# Patient Record
Sex: Female | Born: 1990 | Race: Black or African American | Hispanic: No | Marital: Single | Smoking: Never smoker
Health system: Southern US, Community
[De-identification: ages and names within clinical notes are randomized; demographics above are authoritative.]

---

## 2014-02-11 ENCOUNTER — Emergency Department: Payer: Self-pay | Admitting: Emergency Medicine

## 2014-02-24 DIAGNOSIS — Z3480 Encounter for supervision of other normal pregnancy, unspecified trimester: Secondary | ICD-10-CM | POA: Insufficient documentation

## 2014-02-25 DIAGNOSIS — Z98891 History of uterine scar from previous surgery: Secondary | ICD-10-CM | POA: Insufficient documentation

## 2014-02-25 DIAGNOSIS — O99210 Obesity complicating pregnancy, unspecified trimester: Secondary | ICD-10-CM | POA: Insufficient documentation

## 2014-04-22 DIAGNOSIS — O26892 Other specified pregnancy related conditions, second trimester: Secondary | ICD-10-CM | POA: Insufficient documentation

## 2014-04-22 DIAGNOSIS — Z6791 Unspecified blood type, Rh negative: Secondary | ICD-10-CM | POA: Insufficient documentation

## 2014-07-11 DIAGNOSIS — O34219 Maternal care for unspecified type scar from previous cesarean delivery: Secondary | ICD-10-CM | POA: Insufficient documentation

## 2015-01-07 ENCOUNTER — Encounter: Payer: Self-pay | Admitting: Emergency Medicine

## 2015-01-07 ENCOUNTER — Emergency Department
Admission: EM | Admit: 2015-01-07 | Discharge: 2015-01-07 | Disposition: A | Discharge status: Home / Self Care | Payer: Medicaid Other | Attending: Emergency Medicine | Admitting: Emergency Medicine

## 2015-01-07 DIAGNOSIS — J029 Acute pharyngitis, unspecified: Secondary | ICD-10-CM | POA: Insufficient documentation

## 2015-01-07 DIAGNOSIS — J040 Acute laryngitis: Secondary | ICD-10-CM

## 2015-01-07 LAB — POCT RAPID STREP A: Streptococcus, Group A Screen (Direct): NEGATIVE

## 2015-01-07 MED ORDER — LIDOCAINE VISCOUS 2 % MT SOLN
15.0000 mL | Freq: Once | OROMUCOSAL | Status: AC
  Administered 2015-01-07: 15 mL via OROMUCOSAL
  Filled 2015-01-07: qty 15

## 2015-01-07 MED ORDER — IBUPROFEN 600 MG PO TABS
600.0000 mg | ORAL_TABLET | Freq: Once | ORAL | Status: AC
  Administered 2015-01-07: 600 mg via ORAL
  Filled 2015-01-07: qty 1

## 2015-01-07 MED ORDER — DEXAMETHASONE 1 MG/ML PO CONC
10.0000 mg | Freq: Once | ORAL | Status: AC
  Administered 2015-01-07: 10 mg via ORAL
  Filled 2015-01-07: qty 10

## 2015-01-07 NOTE — Discharge Instructions (Signed)
Laryngitis Laryngitis is inflammation of your vocal cords. This causes hoarseness, coughing, loss of voice, sore throat, or a dry throat. Your vocal cords are two bands of muscles that are found in your throat. When you speak, these cords come together and vibrate. These vibrations come out through your mouth as sound. When your vocal cords are inflamed, your voice sounds different. Laryngitis can be temporary (acute) or long-term (chronic). Most cases of acute laryngitis improve with time. Chronic laryngitis is laryngitis that lasts for more than three weeks. CAUSES Acute laryngitis may be caused by:  A viral infection.  Lots of talking, yelling, or singing. This is also called vocal strain.  Bacterial infections. Chronic laryngitis may be caused by:  Vocal strain.  Injury to your vocal cords.  Acid reflux (gastroesophageal reflux disease or GERD).  Allergies.  Sinus infection.  Smoking.  Alcohol abuse.  Breathing in chemicals or dust.  Growths on the vocal cords. RISK FACTORS Risk factors for laryngitis include:  Smoking.  Alcohol abuse.  Having allergies. SIGNS AND SYMPTOMS Symptoms of laryngitis may include:  Low, hoarse voice.  Loss of voice.  Dry cough.  Sore throat.  Stuffy nose. DIAGNOSIS Laryngitis may be diagnosed by:  Physical exam.  Throat culture.  Blood test.  Laryngoscopy. This procedure allows your health care provider to look at your vocal cords with a mirror or viewing tube. TREATMENT Treatment for laryngitis depends on what is causing it. Usually, treatment involves resting your voice and using medicines to soothe your throat. However, if your laryngitis is caused by a bacterial infection, you may need to take antibiotic medicine. If your laryngitis is caused by a growth, you may need to have a procedure to remove it. HOME CARE INSTRUCTIONS  Drink enough fluid to keep your urine clear or pale yellow.  Breathe in moist air. Use a  humidifier if you live in a dry climate.  Take medicines only as directed by your health care provider.  If you were prescribed an antibiotic medicine, finish it all even if you start to feel better.  Do not smoke cigarettes or electronic cigarettes. If you need help quitting, ask your health care provider.  Talk as little as possible. Also avoid whispering, which can cause vocal strain.  Write instead of talking. Do this until your voice is back to normal. SEEK MEDICAL CARE IF:  You have a fever.  You have increasing pain.  You have difficulty swallowing. SEEK IMMEDIATE MEDICAL CARE IF:  You cough up blood.  You have trouble breathing.   This information is not intended to replace advice given to you by your health care provider. Make sure you discuss any questions you have with your health care provider.   Document Released: 12/21/2004 Document Revised: 01/11/2014 Document Reviewed: 06/05/2013 Elsevier Interactive Patient Education 2016 [employer]  Pharyngitis Pharyngitis is redness, pain, and swelling (inflammation) of your pharynx.  CAUSES  Pharyngitis is usually caused by infection. Most of the time, these infections are from viruses (viral) and are part of a cold. However, sometimes pharyngitis is caused by bacteria (bacterial). Pharyngitis can also be caused by allergies. Viral pharyngitis may be spread from person to person by coughing, sneezing, and personal items or utensils (cups, forks, spoons, toothbrushes). Bacterial pharyngitis may be spread from person to person by more intimate contact, such as kissing.  SIGNS AND SYMPTOMS  Symptoms of pharyngitis include:   Sore throat.   Tiredness (fatigue).   Low-grade fever.   Headache.  Joint  pain and muscle aches.  Skin rashes.  Swollen lymph nodes.  Plaque-like film on throat or tonsils (often seen with bacterial pharyngitis). DIAGNOSIS  Your health care provider will ask you questions about your  illness and your symptoms. Your medical history, along with a physical exam, is often all that is needed to diagnose pharyngitis. Sometimes, a rapid strep test is done. Other lab tests may also be done, depending on the suspected cause.  TREATMENT  Viral pharyngitis will usually get better in 3-4 days without the use of medicine. Bacterial pharyngitis is treated with medicines that kill germs (antibiotics).  HOME CARE INSTRUCTIONS   Drink enough water and fluids to keep your urine clear or pale yellow.   Only take over-the-counter or prescription medicines as directed by your health care provider:   If you are prescribed antibiotics, make sure you finish them even if you start to feel better.   Do not take aspirin.   Get lots of rest.   Gargle with 8 oz of salt water ( tsp of salt per 1 qt of water) as often as every 1-2 hours to soothe your throat.   Throat lozenges (if you are not at risk for choking) or sprays may be used to soothe your throat. SEEK MEDICAL CARE IF:   You have large, tender lumps in your neck.  You have a rash.  You cough up green, yellow-brown, or bloody spit. SEEK IMMEDIATE MEDICAL CARE IF:   Your neck becomes stiff.  You drool or are unable to swallow liquids.  You vomit or are unable to keep medicines or liquids down.  You have severe pain that does not go away with the use of recommended medicines.  You have trouble breathing (not caused by a stuffy nose). MAKE SURE YOU:   Understand these instructions.  Will watch your condition.  Will get help right away if you are not doing well or get worse.   This information is not intended to replace advice given to you by your health care provider. Make sure you discuss any questions you have with your health care provider.   Document Released: 12/21/2004 Document Revised: 10/11/2012 Document Reviewed: 08/28/2012 Elsevier Interactive Patient Education [employer]2016 [employer] Inc[employer]

## 2015-01-07 NOTE — ED Notes (Signed)
Sore throat since yest worse when she swallows.

## 2015-01-07 NOTE — ED Provider Notes (Signed)
[facility] Emergency Department Provider Note  ____________________________________________  Time seen: Approximately 300 AM  I have reviewed the triage vital signs and the nursing notes.   HISTORY  Chief Complaint Sore Throat    HPI Tasha Glover is a 25 y.o. female who comes into the hospital today with throat pain. She reports that she's had some sore throat for the past 2 nights. 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 The patient reports she has some cough that started today. She lost her voice today as well. It hurt to swallow significantly so she decided to come in today for evaluation. The patient reports that she had a fever to 102 yesterday and she took Tylenol. The patient rates her pain a 6 out of 10 in intensity. She reports that her children are sick which has led to her being sick as well. The patient denies any abdominal pain, shortness of breath, dizziness, lightheadedness.   History reviewed. No pertinent past medical history.  There are no active problems to display for this patient.   Past Surgical History  Procedure Laterality Date  . Cesarean section      No current outpatient prescriptions on file.  Allergies Review of patient's allergies indicates no known allergies.  No family history on file.  Social History Social History  Substance Use Topics  . Smoking status: Never Smoker   . Smokeless tobacco: None  . Alcohol Use: No    Review of Systems Constitutional: No fever/chills Eyes: No visual changes. ENT:  sore throat. Cardiovascular: Denies chest pain. Respiratory: Cough but Denies shortness of breath. Gastrointestinal: No abdominal pain.  No nausea, no vomiting.  No diarrhea.  No constipation. Genitourinary: Negative for dysuria. Musculoskeletal: Negative for back pain. Skin: Negative for rash. Neurological: Negative for headaches, focal weakness or  numbness.  10-point ROS otherwise negative.  ____________________________________________   PHYSICAL EXAM:  VITAL SIGNS: ED Triage Vitals  Enc Vitals Group     BP 01/07/15 0148 127/61 mmHg     Pulse Rate 01/07/15 0148 75     Resp 01/07/15 0148 18     Temp 01/07/15 0148 98.6 F (37 C)     Temp Source 01/07/15 0148 Oral     SpO2 01/07/15 0148 100 %     Weight 01/07/15 0148 190 lb (86.183 kg)     Height 01/07/15 0148 5\' 3"  (1.6 m)     Head Cir --      Peak Flow --      Pain Score 01/07/15 0149 6     Pain Loc --      Pain Edu? --      Excl. in GC? --     Constitutional: Alert and oriented. Well appearing and in mild distress. Eyes: Conjunctivae are normal. PERRL. EOMI. Head: Atraumatic. Nose: No congestion/rhinnorhea. Mouth/Throat: Mucous membranes are moist.  Oropharynx non-erythematous. Neck: No palpable lymphadenopathy Cardiovascular: Normal rate, regular rhythm. Grossly normal heart sounds.  Good peripheral circulation. Respiratory: Normal respiratory effort.  No retractions. Lungs CTAB. Gastrointestinal: Soft and nontender. No distention. Positive bowel sounds Musculoskeletal: No lower extremity tenderness nor edema.   Neurologic:  Normal speech and language.  Skin:  Skin is warm, dry and intact.  Psychiatric: Mood and affect are normal.   ____________________________________________   LABS (all labs ordered are listed, but only abnormal results are displayed)  Labs Reviewed  CULTURE, GROUP A STREP ([facility] ONLY)  POCT  RAPID STREP A   ____________________________________________  EKG  None ____________________________________________  RADIOLOGY  None ____________________________________________   PROCEDURES  Procedure(s) performed: None  Critical Care performed: No  ____________________________________________   INITIAL IMPRESSION / ASSESSMENT AND PLAN / ED COURSE  Pertinent labs & imaging results that were available during my care of the  patient were reviewed by me and considered in my medical decision making (see chart for details).  The patient is a 25 year old female who comes into the hospital today with some throat pain. I did give her some Decadron as well as a dose of ibuprofen. The patient reports that she still had some pain so I gave her some viscous lidocaine to swallow to help numb her throat. The patient had a strep test that was negative and she does not have any fever at this time. I will discharge the patient to home and have her follow-up with the acute care clinic. Should the patient's culture return positive we will contact her to give her the appropriate antibiotic medication. ____________________________________________   FINAL CLINICAL IMPRESSION(S) / ED DIAGNOSES  Final diagnoses:  Laryngitis  Pharyngitis      Rebecka ApleyAllison P Kelsye Loomer, MD 01/07/15 [id number]

## 2015-01-07 NOTE — ED Notes (Addendum)
Pt c/o sore throat x 2 days.   01/07/15 0300  HEENT  HEENT (WDL) X  Throat Painful to swallow  Voice Hoarse  Mucous Membrane(s) Moist

## 2015-01-09 LAB — CULTURE, GROUP A STREP (THRC)

## 2015-04-11 ENCOUNTER — Encounter: Payer: Self-pay | Admitting: Emergency Medicine

## 2015-04-11 ENCOUNTER — Emergency Department
Admission: EM | Admit: 2015-04-11 | Discharge: 2015-04-11 | Disposition: A | Discharge status: Home / Self Care | Payer: Medicaid Other | Attending: Emergency Medicine | Admitting: Emergency Medicine

## 2015-04-11 ENCOUNTER — Emergency Department: Payer: Medicaid Other

## 2015-04-11 DIAGNOSIS — O26891 Other specified pregnancy related conditions, first trimester: Secondary | ICD-10-CM | POA: Insufficient documentation

## 2015-04-11 DIAGNOSIS — N760 Acute vaginitis: Secondary | ICD-10-CM | POA: Insufficient documentation

## 2015-04-11 DIAGNOSIS — Z3490 Encounter for supervision of normal pregnancy, unspecified, unspecified trimester: Secondary | ICD-10-CM

## 2015-04-11 DIAGNOSIS — Z3A01 Less than 8 weeks gestation of pregnancy: Secondary | ICD-10-CM | POA: Diagnosis not present

## 2015-04-11 DIAGNOSIS — R109 Unspecified abdominal pain: Secondary | ICD-10-CM

## 2015-04-11 DIAGNOSIS — B9689 Other specified bacterial agents as the cause of diseases classified elsewhere: Secondary | ICD-10-CM

## 2015-04-11 DIAGNOSIS — R1030 Lower abdominal pain, unspecified: Secondary | ICD-10-CM | POA: Diagnosis present

## 2015-04-11 LAB — URINALYSIS COMPLETE WITH MICROSCOPIC (ARMC ONLY)
Bilirubin Urine: NEGATIVE
Glucose, UA: NEGATIVE mg/dL
Ketones, ur: NEGATIVE mg/dL
Leukocytes, UA: NEGATIVE
NITRITE: NEGATIVE
PROTEIN: NEGATIVE mg/dL
SPECIFIC GRAVITY, URINE: 1.018 (ref 1.005–1.030)
pH: 6 (ref 5.0–8.0)

## 2015-04-11 LAB — CBC
HEMATOCRIT: 42.2 % (ref 35.0–47.0)
Hemoglobin: 14.1 g/dL (ref 12.0–16.0)
MCH: 30 pg (ref 26.0–34.0)
MCHC: 33.5 g/dL (ref 32.0–36.0)
MCV: 89.7 fL (ref 80.0–100.0)
Platelets: 250 10*3/uL (ref 150–440)
RBC: 4.7 MIL/uL (ref 3.80–5.20)
RDW: 13.1 % (ref 11.5–14.5)
WBC: 9.6 10*3/uL (ref 3.6–11.0)

## 2015-04-11 LAB — COMPREHENSIVE METABOLIC PANEL
ALT: 16 U/L (ref 14–54)
AST: 23 U/L (ref 15–41)
Albumin: 4.2 g/dL (ref 3.5–5.0)
Alkaline Phosphatase: 38 U/L (ref 38–126)
Anion gap: 1 — ABNORMAL LOW (ref 5–15)
BILIRUBIN TOTAL: 0.6 mg/dL (ref 0.3–1.2)
BUN: 10 mg/dL (ref 6–20)
CHLORIDE: 108 mmol/L (ref 101–111)
CO2: 25 mmol/L (ref 22–32)
CREATININE: 0.72 mg/dL (ref 0.44–1.00)
Calcium: 10.1 mg/dL (ref 8.9–10.3)
GFR calc non Af Amer: >60 mL/min (ref >60)
Glucose, Bld: 84 mg/dL (ref 65–99)
Potassium: 3.1 mmol/L — ABNORMAL LOW (ref 3.5–5.1)
Sodium: 134 mmol/L — ABNORMAL LOW (ref 135–145)
Total Protein: 7.6 g/dL (ref 6.5–8.1)

## 2015-04-11 LAB — HCG, QUANTITATIVE, PREGNANCY: hCG, Beta Chain, Quant, S: 28580 m[IU]/mL — ABNORMAL HIGH (ref <5)

## 2015-04-11 LAB — WET PREP, GENITAL
Sperm: NONE SEEN
TRICH WET PREP: NONE SEEN
Yeast Wet Prep HPF POC: NONE SEEN

## 2015-04-11 LAB — CHLAMYDIA/NGC RT PCR (ARMC ONLY)
Chlamydia Tr: NOT DETECTED
N gonorrhoeae: NOT DETECTED

## 2015-04-11 LAB — PREGNANCY, URINE: PREG TEST UR: POSITIVE — AB

## 2015-04-11 MED ORDER — METRONIDAZOLE 500 MG PO TABS: 500.0000 mg | ORAL_TABLET | Freq: Two times a day (BID) | ORAL | Status: AC

## 2015-04-11 NOTE — Discharge Instructions (Signed)
Bacterial Vaginosis Bacterial vaginosis is a vaginal infection that occurs when the normal balance of bacteria in the vagina is disrupted. It results from an overgrowth of certain bacteria. This is the most common vaginal infection in women of childbearing age. Treatment is important to prevent complications, especially in pregnant women, as it can cause a premature delivery. CAUSES  Bacterial vaginosis is caused by an increase in harmful bacteria that are normally present in smaller amounts in the vagina. Several different kinds of bacteria can cause bacterial vaginosis. However, the reason that the condition develops is not fully understood. RISK FACTORS Certain activities or behaviors can put you at an increased risk of developing bacterial vaginosis, including:  Having a new sex partner or multiple sex partners.  Douching.  Using an intrauterine device (IUD) for contraception. Women do not get bacterial vaginosis from toilet seats, bedding, swimming pools, or contact with objects around them. SIGNS AND SYMPTOMS  Some women with bacterial vaginosis have no signs or symptoms. Common symptoms include:  Grey vaginal discharge.  A fishlike odor with discharge, especially after sexual intercourse.  Itching or burning of the vagina and vulva.  Burning or pain with urination. DIAGNOSIS  Your health care provider will take a medical history and examine the vagina for signs of bacterial vaginosis. A sample of vaginal fluid may be taken. Your health care provider will look at this sample under a microscope to check for bacteria and abnormal cells. A vaginal pH test may also be done.  TREATMENT  Bacterial vaginosis may be treated with antibiotic medicines. These may be given in the form of a pill or a vaginal cream. A second round of antibiotics may be prescribed if the condition comes back after treatment. Because bacterial vaginosis increases your risk for sexually transmitted diseases, getting  treated can help reduce your risk for chlamydia, gonorrhea, HIV, and herpes. HOME CARE INSTRUCTIONS   Only take over-the-counter or prescription medicines as directed by your health care provider.  If antibiotic medicine was prescribed, take it as directed. Make sure you finish it even if you start to feel better.  Tell all sexual partners that you have a vaginal infection. They should see their health care provider and be treated if they have problems, such as a mild rash or itching.  During treatment, it is important that you follow these instructions:  Avoid sexual activity or use condoms correctly.  Do not douche.  Avoid alcohol as directed by your health care provider.  Avoid breastfeeding as directed by your health care provider. SEEK MEDICAL CARE IF:   Your symptoms are not improving after 3 days of treatment.  You have increased discharge or pain.  You have a fever. MAKE SURE YOU:   Understand these instructions.  Will watch your condition.  Will get help right away if you are not doing well or get worse. FOR MORE INFORMATION  Centers for Disease Control and Prevention, Division of STD Prevention: [employer] American Sexual Health Association (ASHA): www.ashastd.org    This information is not intended to replace advice given to you by your health care provider. Make sure you discuss any questions you have with your health care provider.   Document Released: 12/21/2004 Document Revised: 01/11/2014 Document Reviewed: 08/02/2012 Elsevier Interactive Patient Education 2016 [place] of Pregnancy The first trimester of pregnancy is from week 1 until the end of week 12 (months 1 through 3). A week after a sperm fertilizes an egg, the egg will implant on  the wall of the uterus. This embryo will begin to develop into a baby. Genes from you and your partner are forming the baby. The female genes determine whether the baby is a boy or a girl. At 6-8  weeks, the eyes and face are formed, and the heartbeat can be seen on ultrasound. At the end of 12 weeks, all the baby's organs are formed.  Now that you are pregnant, you will want to do everything you can to have a healthy baby. Two of the most important things are to get good prenatal care and to follow your health care provider's instructions. Prenatal care is all the medical care you receive before the baby's birth. This care will help prevent, find, and treat any problems during the pregnancy and childbirth. BODY CHANGES Your body goes through many changes during pregnancy. The changes vary from woman to woman.   You may gain or lose a couple of pounds at first.  You may feel sick to your stomach (nauseous) and throw up (vomit). If the vomiting is uncontrollable, call your health care provider.  You may tire easily.  You may develop headaches that can be relieved by medicines approved by your health care provider.  You may urinate more often. Painful urination may mean you have a bladder infection.  You may develop heartburn as a result of your pregnancy.  You may develop constipation because certain hormones are causing the muscles that push waste through your intestines to slow down.  You may develop hemorrhoids or swollen, bulging veins (varicose veins).  Your breasts may begin to grow larger and become tender. Your nipples may stick out more, and the tissue that surrounds them (areola) may become darker.  Your gums may bleed and may be sensitive to brushing and flossing.  Dark spots or blotches (chloasma, mask of pregnancy) may develop on your face. This will likely fade after the baby is born.  Your menstrual periods will stop.  You may have a loss of appetite.  You may develop cravings for certain kinds of food.  You may have changes in your emotions from day to day, such as being excited to be pregnant or being concerned that something may go wrong with the pregnancy and  baby.  You may have more vivid and strange dreams.  You may have changes in your hair. These can include thickening of your hair, rapid growth, and changes in texture. Some women also have hair loss during or after pregnancy, or hair that feels dry or thin. Your hair will most likely return to normal after your baby is born. WHAT TO EXPECT AT YOUR PRENATAL VISITS During a routine prenatal visit:  You will be weighed to make sure you and the baby are growing normally.  Your blood pressure will be taken.  Your abdomen will be measured to track your baby's growth.  The fetal heartbeat will be listened to starting around week 10 or 12 of your pregnancy.  Test results from any previous visits will be discussed. Your health care provider may ask you:  How you are feeling.  If you are feeling the baby move.  If you have had any abnormal symptoms, such as leaking fluid, bleeding, severe headaches, or abdominal cramping.  If you are using any tobacco products, including cigarettes, chewing tobacco, and electronic cigarettes.  If you have any questions. Other tests that may be performed during your first trimester include:  Blood tests to find your blood type and to check  for the presence of any previous infections. They will also be used to check for low iron levels (anemia) and Rh antibodies. Later in the pregnancy, blood tests for diabetes will be done along with other tests if problems develop.  Urine tests to check for infections, diabetes, or protein in the urine.  An ultrasound to confirm the proper growth and development of the baby.  An amniocentesis to check for possible genetic problems.  Fetal screens for spina bifida and Down syndrome.  You may need other tests to make sure you and the baby are doing well.  HIV (human immunodeficiency virus) testing. Routine prenatal testing includes screening for HIV, unless you choose not to have this test. HOME CARE INSTRUCTIONS    Medicines  Follow your health care provider's instructions regarding medicine use. Specific medicines may be either safe or unsafe to take during pregnancy.  Take your prenatal vitamins as directed.  If you develop constipation, try taking a stool softener if your health care provider approves. Diet  Eat regular, well-balanced meals. Choose a variety of foods, such as meat or vegetable-based protein, fish, milk and low-fat dairy products, vegetables, fruits, and whole grain breads and cereals. Your health care provider will help you determine the amount of weight gain that is right for you.  Avoid raw meat and uncooked cheese. These carry germs that can cause birth defects in the baby.  Eating four or five small meals rather than three large meals a day may help relieve nausea and vomiting. If you start to feel nauseous, eating a few soda crackers can be helpful. Drinking liquids between meals instead of during meals also seems to help nausea and vomiting.  If you develop constipation, eat more high-fiber foods, such as fresh vegetables or fruit and whole grains. Drink enough fluids to keep your urine clear or pale yellow. Activity and Exercise  Exercise only as directed by your health care provider. Exercising will help you:  Control your weight.  Stay in shape.  Be prepared for labor and delivery.  Experiencing pain or cramping in the lower abdomen or low back is a good sign that you should stop exercising. Check with your health care provider before continuing normal exercises.  Try to avoid standing for long periods of time. Move your legs often if you must stand in one place for a long time.  Avoid heavy lifting.  Wear low-heeled shoes, and practice good posture.  You may continue to have sex unless your health care provider directs you otherwise. Relief of Pain or Discomfort  Wear a good support bra for breast tenderness.   Take warm sitz baths to soothe any pain or  discomfort caused by hemorrhoids. Use hemorrhoid cream if your health care provider approves.   Rest with your legs elevated if you have leg cramps or low back pain.  If you develop varicose veins in your legs, wear support hose. Elevate your feet for 15 minutes, 3-4 times a day. Limit salt in your diet. Prenatal Care  Schedule your prenatal visits by the twelfth week of pregnancy. They are usually scheduled monthly at first, then more often in the last 2 months before delivery.  Write down your questions. Take them to your prenatal visits.  Keep all your prenatal visits as directed by your health care provider. Safety  Wear your seat belt at all times when driving.  Make a list of emergency phone numbers, including numbers for family, friends, the hospital, and police and fire departments.  General Tips  Ask your health care provider for a referral to a local prenatal education class. Begin classes no later than at the beginning of month 6 of your pregnancy.  Ask for help if you have counseling or nutritional needs during pregnancy. Your health care provider can offer advice or refer you to specialists for help with various needs.  Do not use hot tubs, steam rooms, or saunas.  Do not douche or use tampons or scented sanitary pads.  Do not cross your legs for long periods of time.  Avoid cat litter boxes and soil used by cats. These carry germs that can cause birth defects in the baby and possibly loss of the fetus by miscarriage or stillbirth.  Avoid all smoking, herbs, alcohol, and medicines not prescribed by your health care provider. Chemicals in these affect the formation and growth of the baby.  Do not use any tobacco products, including cigarettes, chewing tobacco, and electronic cigarettes. If you need help quitting, ask your health care provider. You may receive counseling support and other resources to help you quit.  Schedule a dentist appointment. At home, brush your  teeth with a soft toothbrush and be gentle when you floss. SEEK MEDICAL CARE IF:   You have dizziness.  You have mild pelvic cramps, pelvic pressure, or nagging pain in the abdominal area.  You have persistent nausea, vomiting, or diarrhea.  You have a bad smelling vaginal discharge.  You have pain with urination.  You notice increased swelling in your face, hands, legs, or ankles. SEEK IMMEDIATE MEDICAL CARE IF:   You have a fever.  You are leaking fluid from your vagina.  You have spotting or bleeding from your vagina.  You have severe abdominal cramping or pain.  You have rapid weight gain or loss.  You vomit blood or material that looks like coffee grounds.  You are exposed to [place] measles and have never had them.  You are exposed to fifth disease or chickenpox.  You develop a severe headache.  You have shortness of breath.  You have any kind of trauma, such as from a fall or a car accident.   This information is not intended to replace advice given to you by your health care provider. Make sure you discuss any questions you have with your health care provider.   Document Released: 12/15/2000 Document Revised: 01/11/2014 Document Reviewed: 10/31/2012 Elsevier Interactive Patient Education 2016 Elsevier Inc.  Abdominal Pain, Adult Many things can cause abdominal pain. Usually, abdominal pain is not caused by a disease and will improve without treatment. It can often be observed and treated at home. Your health care provider will do a physical exam and possibly order blood tests and X-rays to help determine the seriousness of your pain. However, in many cases, more time must pass before a clear cause of the pain can be found. Before that point, your health care provider may not know if you need more testing or further treatment. HOME CARE INSTRUCTIONS Monitor your abdominal pain for any changes. The following actions may help to alleviate any discomfort you are  experiencing:  Only take over-the-counter or prescription medicines as directed by your health care provider.  Do not take laxatives unless directed to do so by your health care provider.  Try a clear liquid diet (broth, tea, or water) as directed by your health care provider. Slowly move to a bland diet as tolerated. SEEK MEDICAL CARE IF:  You have unexplained abdominal pain.  You  have abdominal pain associated with nausea or diarrhea.  You have pain when you urinate or have a bowel movement.  You experience abdominal pain that wakes you in the night.  You have abdominal pain that is worsened or improved by eating food.  You have abdominal pain that is worsened with eating fatty foods.  You have a fever. SEEK IMMEDIATE MEDICAL CARE IF:  Your pain does not go away within 2 hours.  You keep throwing up (vomiting).  Your pain is felt only in portions of the abdomen, such as the right side or the left lower portion of the abdomen.  You pass bloody or black tarry stools. MAKE SURE YOU:  Understand these instructions.  Will watch your condition.  Will get help right away if you are not doing well or get worse.   This information is not intended to replace advice given to you by your health care provider. Make sure you discuss any questions you have with your health care provider.   Document Released: 09/30/2004 Document Revised: 09/11/2014 Document Reviewed: 08/30/2012 Elsevier Interactive Patient Education [employer]

## 2015-04-11 NOTE — ED Provider Notes (Signed)
[facility] Emergency Department Provider Note  ____________________________________________  Time seen: Approximately 234 AM  I have reviewed the triage vital signs and the nursing notes.   HISTORY  Chief Complaint Abdominal Pain    HPI Tasha Glover is a 25 y.o. female comes into the hospital today with lower abdominal pain. The patient reports that the patient started around midnight. She reports she was working when this started. The patient reports is cramping and stabbing and she rates the pain a 6 out of 10 in intensity. The patient did not take anything for pain. She reports that she is pregnant but she does not know exactly how far along she is. The patient's last menstrual period was 02/21/2015. The patient reports that she's had no vaginal bleeding but she was concerned something may be going on so she decided to come in to be checked out.The patient is a G4 P3003   No past medical history  There are no active problems to display for this patient.   Past Surgical History  Procedure Laterality Date  . Cesarean section      Current Outpatient Rx  Name  Route  Sig  Dispense  Refill  . metroNIDAZOLE (FLAGYL) 500 MG tablet   Oral   Take 1 tablet (500 mg total) by mouth 2 (two) times daily.   14 tablet   0     Allergies Review of patient's allergies indicates no known allergies.  History reviewed. No pertinent family history.  Social History Social History  Substance Use Topics  . Smoking status: Never Smoker   . Smokeless tobacco: None  . Alcohol Use: No    Review of Systems Constitutional: No fever/chills Eyes: No visual changes. ENT: No sore throat. Cardiovascular: Denies chest pain. Respiratory: Denies shortness of breath. Gastrointestinal:  abdominal pain.  No nausea, no vomiting.  No diarrhea.  No constipation. Genitourinary: Negative for dysuria. Musculoskeletal: Negative for back pain. Skin: Negative for  rash. Neurological: Negative for headaches, focal weakness or numbness.  10-point ROS otherwise negative.  ____________________________________________   PHYSICAL EXAM:  VITAL SIGNS: ED Triage Vitals  Enc Vitals Group     BP 04/11/15 0219 119/73 mmHg     Pulse Rate 04/11/15 0219 66     Resp 04/11/15 0219 18     Temp 04/11/15 0219 97.9 F (36.6 C)     Temp Source 04/11/15 0219 Oral     SpO2 04/11/15 0219 100 %     Weight 04/11/15 0219 200 lb (90.719 kg)     Height 04/11/15 0219 5\' 2"  (1.575 m)     Head Cir --      Peak Flow --      Pain Score 04/11/15 0220 6     Pain Loc --      Pain Edu? --      Excl. in GC? --     Constitutional: Alert and oriented. Well appearing and in no acute distress. Eyes: Conjunctivae are normal. PERRL. EOMI. Head: Atraumatic. Nose: No congestion/rhinnorhea. Mouth/Throat: Mucous membranes are moist.  Oropharynx non-erythematous. Cardiovascular: Normal rate, regular rhythm. Grossly normal heart sounds.  Good peripheral circulation. Respiratory: Normal respiratory effort.  No retractions. Lungs CTAB. Gastrointestinal: Soft and nontender. No distention. Positive bowel sounds  Genitourinary: Normal external genitalia, small discharge in vault Musculoskeletal: No lower extremity tenderness nor edema. Neurologic:  Normal speech and language.  Skin:  Skin is warm, dry and intact.  Psychiatric: Mood and affect are normal.   ____________________________________________  LABS (all labs ordered are listed, but only abnormal results are displayed)  Labs Reviewed  WET PREP, GENITAL - Abnormal; Notable for the following:    Clue Cells Wet Prep HPF POC PRESENT (*)    WBC, Wet Prep HPF POC FEW (*)    All other components within normal limits  COMPREHENSIVE METABOLIC PANEL - Abnormal; Notable for the following:    Sodium 134 (*)    Potassium 3.1 (*)    Anion gap 1 (*)    All other components within normal limits  URINALYSIS COMPLETEWITH MICROSCOPIC  ([facility] ONLY) - Abnormal; Notable for the following:    Color, Urine YELLOW (*)    APPearance CLEAR (*)    Hgb urine dipstick 1+ (*)    Bacteria, UA RARE (*)    Squamous Epithelial / LPF 0-5 (*)    All other components within normal limits  PREGNANCY, URINE - Abnormal; Notable for the following:    Preg Test, Ur POSITIVE (*)    All other components within normal limits  HCG, QUANTITATIVE, PREGNANCY - Abnormal; Notable for the following:    hCG, Beta Chain, Quant, S 09811 (*)    All other components within normal limits  CHLAMYDIA/NGC RT PCR ([facility] ONLY)  CBC   ____________________________________________  EKG  none ____________________________________________  RADIOLOGY  US OB: Single live intrauterine pregnancy estimated gestational age [redacted] weeks and 3 days for estimated to leave delivery 11/30/15. No sac not confidently visualized may be secondary to technical factors. ____________________________________________   PROCEDURES  Procedure(s) performed: None  Critical Care performed: No  ____________________________________________   INITIAL IMPRESSION / ASSESSMENT AND PLAN / ED COURSE  Pertinent labs & imaging results that were available during my care of the patient were reviewed by me and considered in my medical decision making (see chart for details).  This is a 25 year old female who comes into the hospital today with some lower belly pain. The patient is pregnant. I did perform an ultrasound was negative. The patient's urinalysis is also negative. The patient does appear to have some bacterial vaginosis so I will give her some metronidazole to treat her symptoms. Otherwise the patient has no further complaints or concerns and she'll be discharged home to follow-up with her primary care physician. ____________________________________________   FINAL CLINICAL IMPRESSION(S) / ED DIAGNOSES  Final diagnoses:  Abdominal pain  Bacterial vaginosis  Pregnancy       [name], MD 04/11/15 [id number]

## 2015-04-11 NOTE — ED Notes (Addendum)
Pt in with co lower abd pain no vomiting or diarrhea, no dysuria;. Pt is pregnant but not sure of gestation.

## 2015-04-21 DIAGNOSIS — E669 Obesity, unspecified: Secondary | ICD-10-CM | POA: Insufficient documentation

## 2015-04-27 DIAGNOSIS — Z6791 Unspecified blood type, Rh negative: Secondary | ICD-10-CM | POA: Insufficient documentation

## 2015-05-19 DIAGNOSIS — Z3480 Encounter for supervision of other normal pregnancy, unspecified trimester: Secondary | ICD-10-CM | POA: Insufficient documentation

## 2016-09-05 IMAGING — US US OB COMP LESS 14 WK
1 series · 13 of 28 positions shown · non-contrast
Comparison: None.

CLINICAL DATA: Pregnant patient in first-trimester pregnancy with
abdominal pain for 1 day.

EXAM:
OBSTETRIC <14 WK US AND TRANSVAGINAL OB US
TECHNIQUE: Both transabdominal and transvaginal ultrasound examinations were
performed for complete evaluation of the gestation as well as the
maternal uterus, adnexal regions, and pelvic cul-de-sac.
Transvaginal technique was performed to assess early pregnancy.

[Series 1: us ob comp less 14 wk · 0.22mm/px · 13 of 75 slices shown]
[im 3/75]
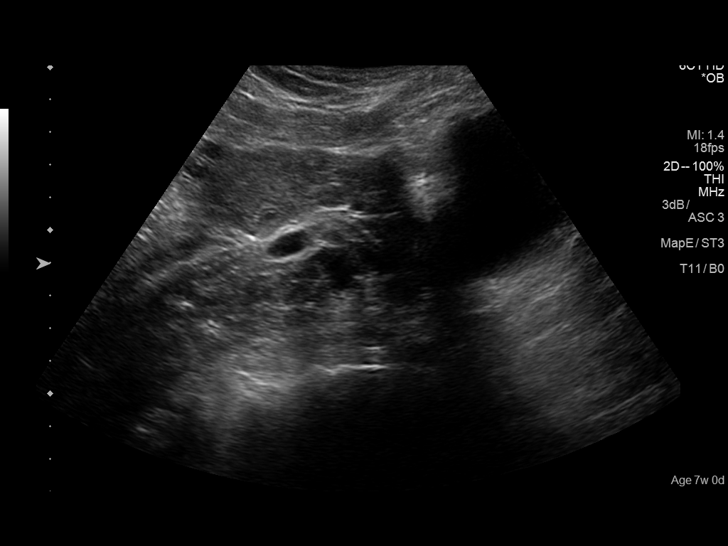
[im 9/75]
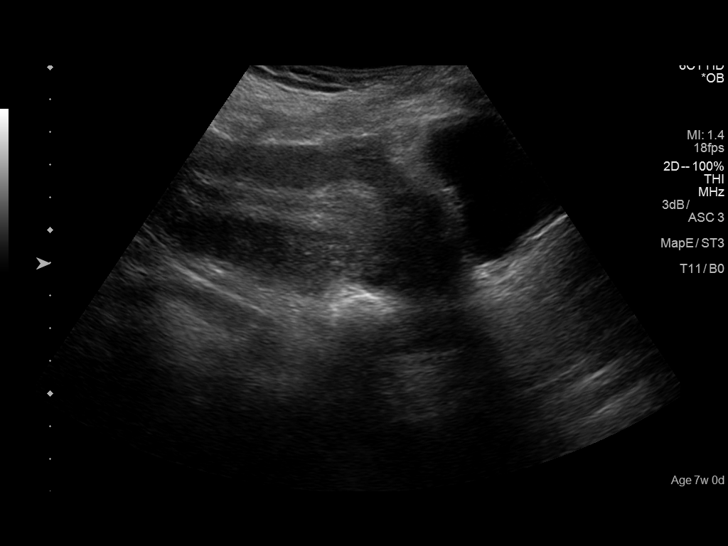
[im 14/75]
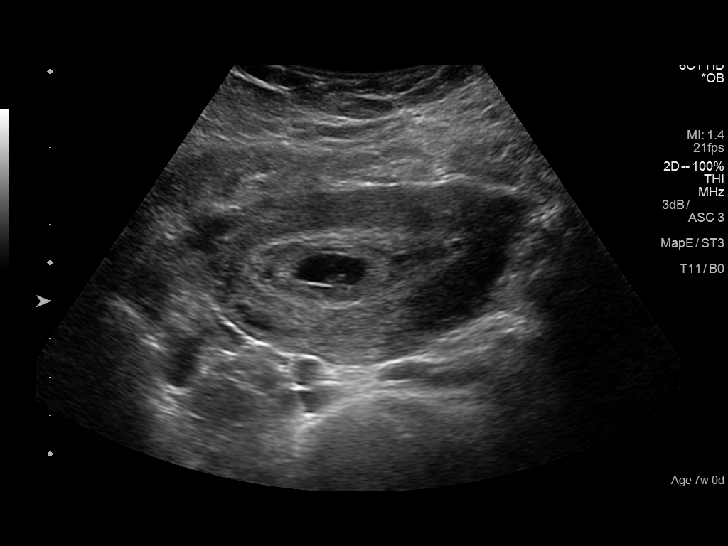
[im 20/75]
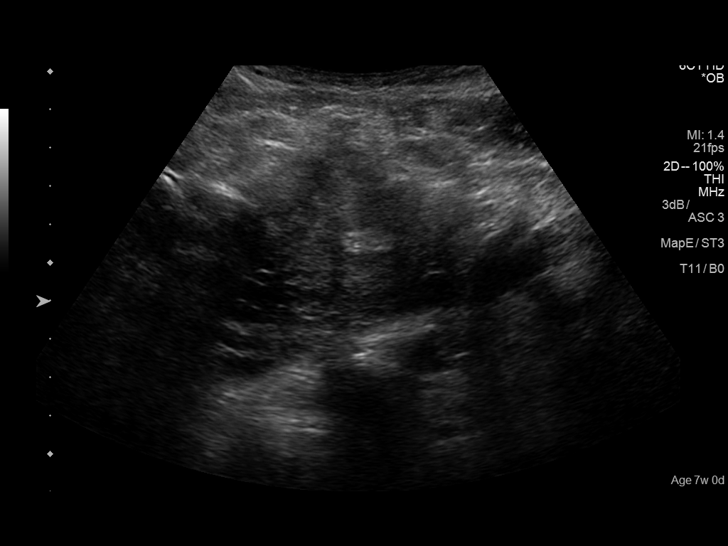
[im 25/75]
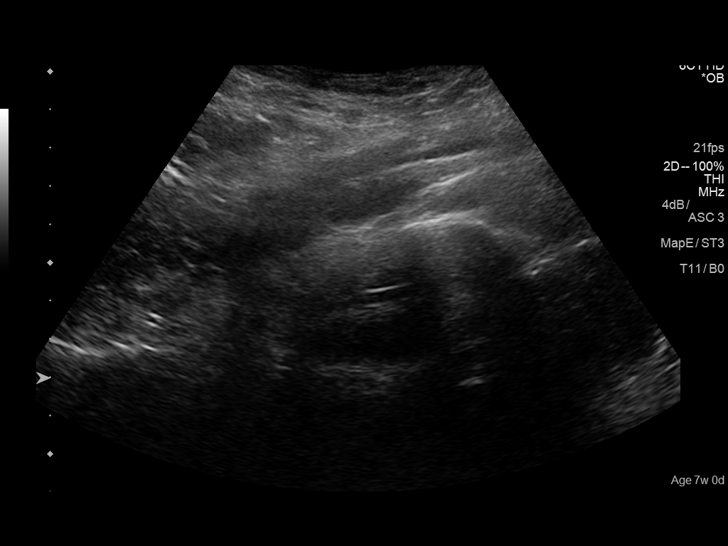
[im 31/75]
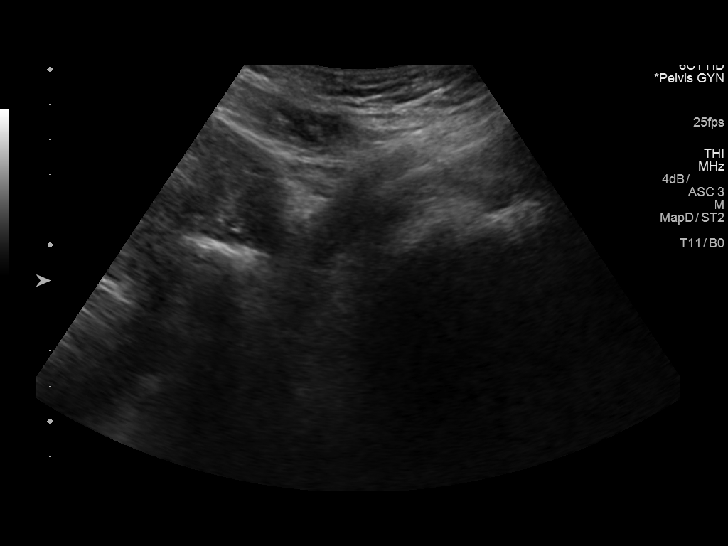
[im 39/75]
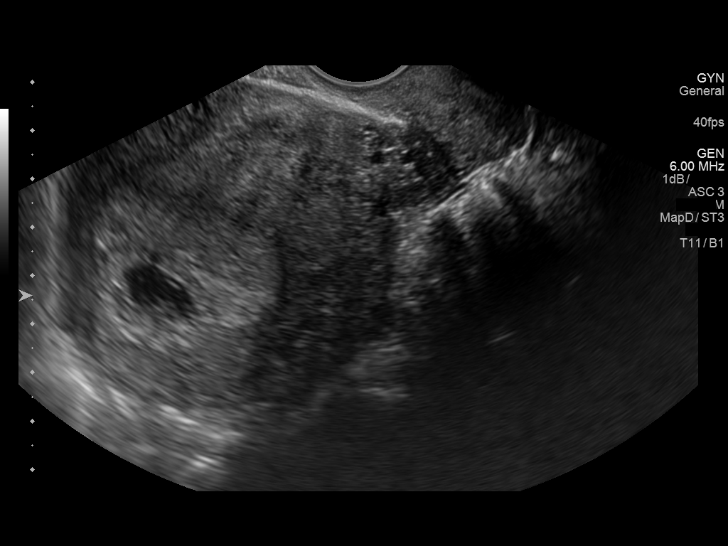
[im 44/75]
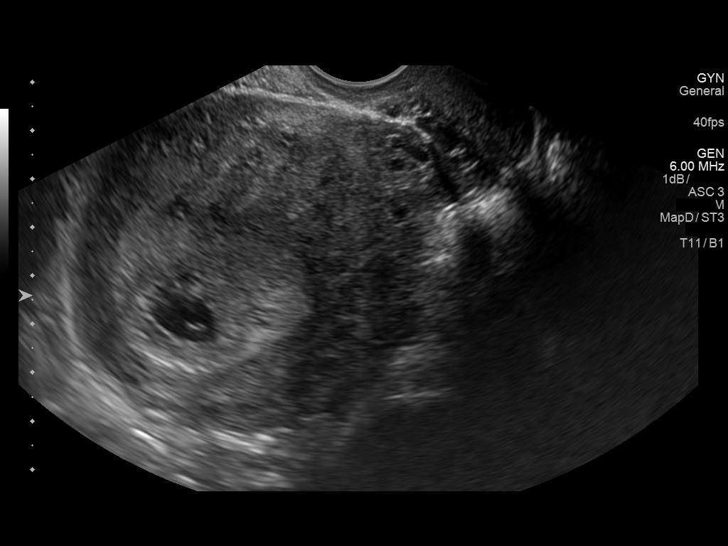
[im 50/75]
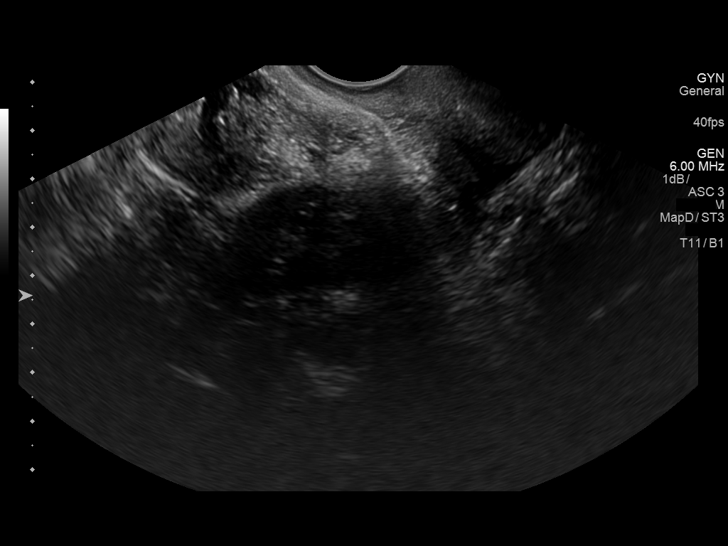
[im 55/75]
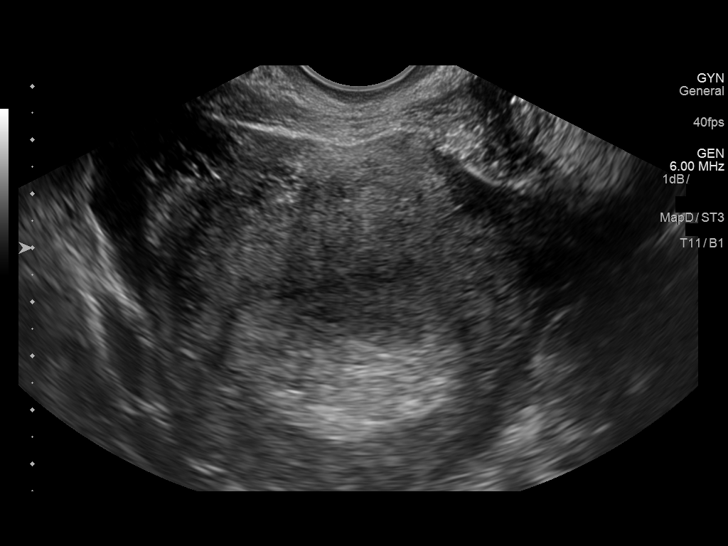
[im 61/75]
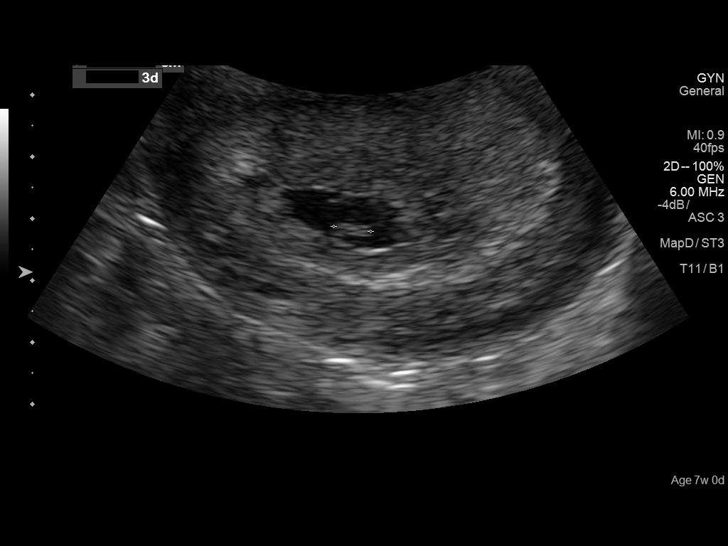
[im 66/75]
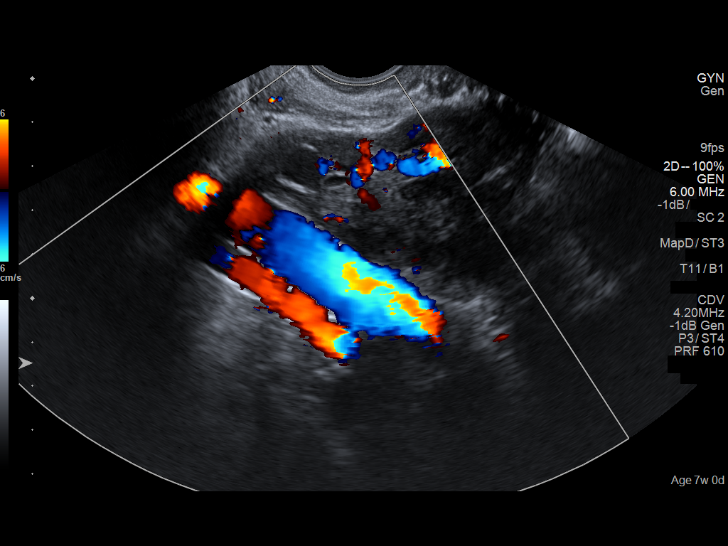
[im 72/75]
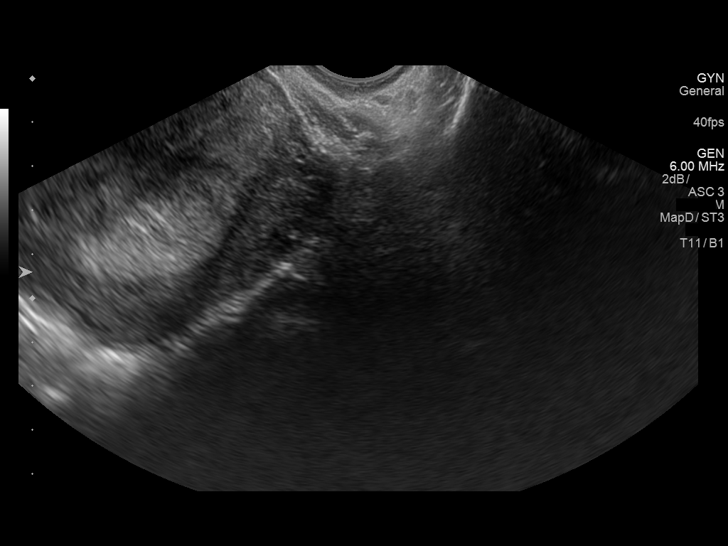

[13 of 28 positions shown; findings below may reference images not displayed]

FINDINGS: Intrauterine gestational sac: Visualized/normal in shape.

Yolk sac:  Not definitively visualized.

Embryo:  Present.

Cardiac Activity: Present.

Heart Rate: 122  bpm

CRL:  6.2  mm   6 w   3 d                  US EDC: 12/02/2015

Subchorionic hemorrhage:  None visualized.

Maternal uterus/adnexae: The left ovary is normal. Right ovary is
not seen. No adnexal mass. No pelvic free fluid.

Transvaginal exam was limited secondary to tenderness with probe
pressure.
IMPRESSION: 1. Single live intrauterine pregnancy estimated gestational age 6
weeks 3 days for estimated date of delivery 12/02/2015.
2. The yolk sac not confidently visualized, may be secondary to
technical factors. Recommend trending of beta HCG, and sonographic
follow-up as indicated.

## 2019-11-28 ENCOUNTER — Encounter: Payer: Self-pay | Admitting: *Deleted

## 2019-11-28 ENCOUNTER — Other Ambulatory Visit: Payer: Self-pay

## 2019-11-28 ENCOUNTER — Emergency Department
Admission: EM | Admit: 2019-11-28 | Discharge: 2019-11-28 | Disposition: A | Discharge status: Home / Self Care | Payer: Medicaid Other | Attending: Student in an Organized Health Care Education/Training Program | Admitting: Student in an Organized Health Care Education/Training Program

## 2019-11-28 DIAGNOSIS — Z23 Encounter for immunization: Secondary | ICD-10-CM | POA: Insufficient documentation

## 2019-11-28 DIAGNOSIS — S61111A Laceration without foreign body of right thumb with damage to nail, initial encounter: Secondary | ICD-10-CM | POA: Insufficient documentation

## 2019-11-28 DIAGNOSIS — Y93G1 Activity, food preparation and clean up: Secondary | ICD-10-CM | POA: Insufficient documentation

## 2019-11-28 DIAGNOSIS — W274XXA Contact with kitchen utensil, initial encounter: Secondary | ICD-10-CM | POA: Diagnosis not present

## 2019-11-28 MED ORDER — LIDOCAINE HCL (PF) 1 % IJ SOLN
10.0000 mL | Freq: Once | INTRAMUSCULAR | Status: AC
  Administered 2019-11-28: 10 mL via INTRADERMAL
  Filled 2019-11-28: qty 10

## 2019-11-28 MED ORDER — CEPHALEXIN 500 MG PO CAPS: 1000.0000 mg | ORAL_CAPSULE | Freq: Two times a day (BID) | ORAL | 0 refills | Status: AC

## 2019-11-28 MED ORDER — TETANUS-DIPHTH-ACELL PERTUSSIS 5-2.5-18.5 LF-MCG/0.5 IM SUSY
0.5000 mL | PREFILLED_SYRINGE | Freq: Once | INTRAMUSCULAR | Status: AC
  Administered 2019-11-28: 0.5 mL via INTRAMUSCULAR
  Filled 2019-11-28: qty 0.5

## 2019-11-28 NOTE — ED Provider Notes (Signed)
[facility] Emergency Department Provider Note   ____________________________________________   First MD Initiated Contact with Patient 11/28/19 2147     (approximate)  I have reviewed the triage vital signs and the nursing notes.   HISTORY  Chief Complaint Laceration  HPI Tasha Glover is a 29 y.o. female who reports to the emergency department for evaluation of right thumb. The patient states that she was using a mandolin to try to cut onions for Thanksgiving meal when it slipped and she sliced her right thumb. She has been having difficulty controlling the bleeding going through multiple gauze at home. She denies any blood thinner use. She denies any pain with range of motion of the thumb. Pain is rated a 10/10 located in the distal right thumb. She has not tried any alleviating measures of her to this point.        No past medical history on file.  There are no problems to display for this patient.   Past Surgical History:  Procedure Laterality Date  . CESAREAN SECTION      Prior to Admission medications   Medication Sig Start Date End Date Taking? Authorizing Provider  cephALEXin (KEFLEX) 500 MG capsule Take 2 capsules (1,000 mg total) by mouth 2 (two) times daily for 10 days. 11/28/19 12/08/19  Lucy Chris, PA    Allergies Patient has no known allergies.  No family history on file.  Social History Social History   Tobacco Use  . Smoking status: Never Smoker  . Smokeless tobacco: Never Used  Substance Use Topics  . Alcohol use: No  . Drug use: Not on file    Review of Systems Constitutional: No fever/chills Eyes: No visual changes. ENT: No sore throat. Cardiovascular: Denies chest pain. Respiratory: Denies shortness of breath. Gastrointestinal: No abdominal pain.  No nausea, no vomiting.  No diarrhea.  No constipation. Genitourinary: Negative for dysuria. Musculoskeletal: Negative for back pain. Skin: + Right thumb  laceration, negative for rash. Neurological: Negative for headaches, focal weakness or numbness.   ____________________________________________   PHYSICAL EXAM:  VITAL SIGNS: ED Triage Vitals  Enc Vitals Group     BP 11/28/19 2114 133/84     Pulse Rate 11/28/19 2114 82     Resp 11/28/19 2114 18     Temp 11/28/19 2114 98.3 F (36.8 C)     Temp Source 11/28/19 2114 Oral     SpO2 11/28/19 2114 100 %     Weight 11/28/19 2114 245 lb (111.1 kg)     Height 11/28/19 2119 5\' 2"  (1.575 m)     Head Circumference --      Peak Flow --      Pain Score 11/28/19 2119 10     Pain Loc --      Pain Edu? --      Excl. in GC? --     Constitutional: Alert and oriented. Well appearing and in no acute distress. Eyes: Conjunctivae are normal. EOMI. Head: Atraumatic. Nose: No congestion/rhinnorhea. Mouth/Throat: Mucous membranes are moist.  Oropharynx non-erythematous. Neck: No stridor.   Cardiovascular: Normal rate, regular rhythm.  Good peripheral circulation. Respiratory: Normal respiratory effort.  No retractions.  Musculoskeletal: There is full range of motion actively of the right thumb and other digits of the right hand. Full range of motion of the IP specifically as well as the MCP right thumb. Neurologic:  Normal speech and language. No gross focal neurologic deficits are appreciated. No gait instability. Skin: There is a  2 cm U-shaped laceration of the radial side of the right thumb extending into the nail. The nail is still secure to the nailbed as well as to itself with approximately 0.5 cm of cut into the nail. There is minimal active bleeding at this time. Capillary refill of the right thumb is less than 3 seconds. Psychiatric: Mood and affect are normal. Speech and behavior are normal.  ____________________________________________   PROCEDURES  Procedure(s) performed (including Critical Care):  Marland KitchenMarland KitchenLaceration Repair  Date/Time: 11/28/2019 11:48 PM Performed by: Lucy Chris, PA Authorized by: Lucy Chris, PA   Consent:    Consent obtained:  Verbal   Consent given by:  Patient   Risks discussed:  Infection, pain, poor cosmetic result and need for additional repair   Alternatives discussed:  No treatment Anesthesia (see MAR for exact dosages):    Anesthesia method:  Nerve block   Block needle gauge:  30 G   Block anesthetic:  Lidocaine 1% w/o epi   Block injection procedure:  Introduced needle, incremental injection and anatomic landmarks palpated   Block outcome:  Anesthesia achieved Laceration details:    Location:  Finger   Finger location:  R thumb   Length (cm):  2   Depth (mm):  5 Repair type:    Repair type:  Simple Pre-procedure details:    Preparation:  Patient was prepped and draped in usual sterile fashion Exploration:    Hemostasis achieved with:  Direct pressure Treatment:    Area cleansed with:  Betadine   Amount of cleaning:  Standard   Irrigation solution:  Sterile saline   Irrigation method:  Tap Skin repair:    Repair method:  Sutures and tissue adhesive   Suture size:  5-0   Suture material:  Nylon   Suture technique:  Simple interrupted   Number of sutures:  6 (Tissue adhesive used on the nail portion, 6 sutures in the skin portion) Approximation:    Approximation:  Close Post-procedure details:    Dressing:  Open (no dressing)   Patient tolerance of procedure:  Tolerated well, no immediate complications     ____________________________________________   INITIAL IMPRESSION / ASSESSMENT AND PLAN / ED COURSE  As part of my medical decision making, I reviewed the following data within the electronic MEDICAL RECORD NUMBER Nursing notes reviewed and incorporated        A 42-year-old female presents emergency department for evaluation of laceration to the right thumb. See HPI for further details. The risks and benefits of laceration repair were discussed with the patient and she elected to proceed. See procedure  notes for further details. Patient's Tdap was updated and she was placed on prophylactic antibiotics given that this was a hand laceration. The patient was advised to have the sutures removed in 5 to 7 days at a primary care, urgent care or at the emergency department. Patient is amenable with this plan and will return to the ED for any acute worsening.      ____________________________________________   FINAL CLINICAL IMPRESSION(S) / ED DIAGNOSES  Final diagnoses:  Laceration of right thumb without foreign body with damage to nail, initial encounter     ED Discharge Orders         Ordered    cephALEXin (KEFLEX) 500 MG capsule  2 times daily        11/28/19 2230          *Please note:  Karena Kinker was evaluated in Emergency Department on 11/28/2019  for the symptoms described in the history of present illness. She was evaluated in the context of the global COVID-19 pandemic, which necessitated consideration that the patient might be at risk for infection with the SARS-CoV-2 virus that causes COVID-19. Institutional protocols and algorithms that pertain to the evaluation of patients at risk for COVID-19 are in a state of rapid change based on information released by regulatory bodies including the CDC and federal and state organizations. These policies and algorithms were followed during the patient's care in the ED.  Some ED evaluations and interventions may be delayed as a result of limited staffing during and the pandemic.*   Note:  This document was prepared using Dragon voice recognition software and may include unintentional dictation errors.    Lucy Chris, PA 11/28/19 2351    Willy Eddy, MD 11/28/19 [phone]

## 2019-11-28 NOTE — ED Triage Notes (Signed)
Pt has laceration to right thumb.  Cut on a slicer in the kitchen tonight   Bleeding controlled.   Pt alert

## 2022-07-13 ENCOUNTER — Ambulatory Visit
Admission: EM | Admit: 2022-07-13 | Discharge: 2022-07-13 | Disposition: A | Discharge status: Home / Self Care | Payer: No Typology Code available for payment source | Attending: Emergency Medicine | Admitting: Emergency Medicine

## 2022-07-13 DIAGNOSIS — E669 Obesity, unspecified: Secondary | ICD-10-CM | POA: Insufficient documentation

## 2022-07-13 DIAGNOSIS — Z0289 Encounter for other administrative examinations: Secondary | ICD-10-CM | POA: Insufficient documentation

## 2022-07-13 DIAGNOSIS — Z5900 Homelessness unspecified: Secondary | ICD-10-CM | POA: Insufficient documentation

## 2022-07-13 DIAGNOSIS — R3915 Urgency of urination: Secondary | ICD-10-CM | POA: Insufficient documentation

## 2022-07-13 DIAGNOSIS — M546 Pain in thoracic spine: Secondary | ICD-10-CM | POA: Insufficient documentation

## 2022-07-13 DIAGNOSIS — G5600 Carpal tunnel syndrome, unspecified upper limb: Secondary | ICD-10-CM | POA: Insufficient documentation

## 2022-07-13 DIAGNOSIS — H5200 Hypermetropia, unspecified eye: Secondary | ICD-10-CM | POA: Insufficient documentation

## 2022-07-13 DIAGNOSIS — M79609 Pain in unspecified limb: Secondary | ICD-10-CM | POA: Insufficient documentation

## 2022-07-13 DIAGNOSIS — G4489 Other headache syndrome: Secondary | ICD-10-CM | POA: Insufficient documentation

## 2022-07-13 DIAGNOSIS — Z1152 Encounter for screening for COVID-19: Secondary | ICD-10-CM | POA: Insufficient documentation

## 2022-07-13 DIAGNOSIS — R7301 Impaired fasting glucose: Secondary | ICD-10-CM | POA: Insufficient documentation

## 2022-07-13 DIAGNOSIS — G43809 Other migraine, not intractable, without status migrainosus: Secondary | ICD-10-CM | POA: Diagnosis not present

## 2022-07-13 DIAGNOSIS — N739 Female pelvic inflammatory disease, unspecified: Secondary | ICD-10-CM | POA: Insufficient documentation

## 2022-07-13 DIAGNOSIS — R55 Syncope and collapse: Secondary | ICD-10-CM | POA: Insufficient documentation

## 2022-07-13 DIAGNOSIS — H40009 Preglaucoma, unspecified, unspecified eye: Secondary | ICD-10-CM | POA: Insufficient documentation

## 2022-07-13 DIAGNOSIS — Z719 Counseling, unspecified: Secondary | ICD-10-CM | POA: Insufficient documentation

## 2022-07-13 DIAGNOSIS — M25569 Pain in unspecified knee: Secondary | ICD-10-CM | POA: Insufficient documentation

## 2022-07-13 DIAGNOSIS — M549 Dorsalgia, unspecified: Secondary | ICD-10-CM | POA: Insufficient documentation

## 2022-07-13 DIAGNOSIS — Z7189 Other specified counseling: Secondary | ICD-10-CM | POA: Insufficient documentation

## 2022-07-13 DIAGNOSIS — Z124 Encounter for screening for malignant neoplasm of cervix: Secondary | ICD-10-CM | POA: Insufficient documentation

## 2022-07-13 DIAGNOSIS — Z59811 Housing instability, housed, with risk of homelessness: Secondary | ICD-10-CM | POA: Insufficient documentation

## 2022-07-13 DIAGNOSIS — M545 Low back pain, unspecified: Secondary | ICD-10-CM | POA: Insufficient documentation

## 2022-07-13 DIAGNOSIS — Z8751 Personal history of pre-term labor: Secondary | ICD-10-CM | POA: Insufficient documentation

## 2022-07-13 DIAGNOSIS — R209 Unspecified disturbances of skin sensation: Secondary | ICD-10-CM | POA: Insufficient documentation

## 2022-07-13 DIAGNOSIS — Z713 Dietary counseling and surveillance: Secondary | ICD-10-CM | POA: Insufficient documentation

## 2022-07-13 DIAGNOSIS — Z91018 Allergy to other foods: Secondary | ICD-10-CM | POA: Insufficient documentation

## 2022-07-13 DIAGNOSIS — O34219 Maternal care for unspecified type scar from previous cesarean delivery: Secondary | ICD-10-CM | POA: Insufficient documentation

## 2022-07-13 DIAGNOSIS — H52229 Regular astigmatism, unspecified eye: Secondary | ICD-10-CM | POA: Insufficient documentation

## 2022-07-13 LAB — SARS CORONAVIRUS 2 BY RT PCR: SARS Coronavirus 2 by RT PCR: NEGATIVE

## 2022-07-13 MED ORDER — ONDANSETRON 8 MG PO TBDP: ORAL_TABLET | ORAL | 0 refills | Status: AC

## 2022-07-13 MED ORDER — ONDANSETRON 8 MG PO TBDP
8.0000 mg | ORAL_TABLET | Freq: Once | ORAL | Status: AC
  Administered 2022-07-13: 8 mg via ORAL

## 2022-07-13 MED ORDER — KETOROLAC TROMETHAMINE 30 MG/ML IJ SOLN
30.0000 mg | Freq: Once | INTRAMUSCULAR | Status: AC
  Administered 2022-07-13: 30 mg via INTRAMUSCULAR

## 2022-07-13 MED ORDER — DEXAMETHASONE SODIUM PHOSPHATE 10 MG/ML IJ SOLN
10.0000 mg | Freq: Once | INTRAMUSCULAR | Status: AC
  Administered 2022-07-13: 10 mg via INTRAMUSCULAR

## 2022-07-13 MED ORDER — NAPROXEN 500 MG PO TABS: 500.0000 mg | ORAL_TABLET | Freq: Two times a day (BID) | ORAL | 0 refills | Status: AC

## 2022-07-13 NOTE — ED Provider Notes (Signed)
HPI  SUBJECTIVE:  Tasha Glover is a 32 y.o. female who reports waking up with 5 days of a constant, throbbing frontal headache with photophobia.  She states that she felt feverish with chills the first day, but did not take her temperature.  None since.  No body aches, nasal congestion, rhinorrhea, sinus pain or pressure, sore throat, cough, wheeze, shortness of breath, nausea, vomiting.  No ear, dental pain, phonophobia, visual changes, slurred speech, facial droop, numbness, tingling or weakness in her face, arm or leg.  No syncope, seizure.  No rash, neck stiffness.  She has been taking Benadryl, ibuprofen and Tylenol.  Last dose of Tylenol was within 6 hours of evaluation.  She states the ibuprofen and Tylenol help.  Symptoms are worse with exposure to light.  This is not the first or worst headache of her life.  She states this is similar to previous migraines.  She has a past medical history of migraines.  No history of temporal arteritis, atrial fibrillation, aneurysm, diabetes, hypertension.  LMP: June 11.  Denies possibility being pregnant.  PCP: [facility].     History reviewed. No pertinent past medical history.  Past Surgical History:  Procedure Laterality Date   CESAREAN SECTION      History reviewed. No pertinent family history.  Social History   Tobacco Use   Smoking status: Never   Smokeless tobacco: Never  Vaping Use   Vaping Use: Never used  Substance Use Topics   Alcohol use: No    No current facility-administered medications for this encounter.  Current Outpatient Medications:    naproxen (NAPROSYN) 500 MG tablet, Take 1 tablet (500 mg total) by mouth 2 (two) times daily., Disp: 20 tablet, Rfl: 0   ondansetron (ZOFRAN-ODT) 8 MG disintegrating tablet, 1/2- 1 tablet q 8 hr prn nausea, vomiting, Disp: 20 tablet, Rfl: 0  Allergies  Allergen Reactions   Almond Oil Swelling   Apple Hives and Rash   Apple Juice Hives     ROS  As noted in HPI.   Physical  Exam  BP 123/85 (BP Location: Right Arm)   Pulse 79   Temp 98.5 F (36.9 C) (Oral)   LMP 06/15/2022 (Exact Date)   SpO2 98%   Constitutional: Well developed, well nourished, no acute distress Eyes: PERRL, EOMI, conjunctiva normal bilaterally.  Positive bilateral photophobia HENT: Normocephalic, atraumatic,mucus membranes moist, normal dentition.  TM normal b/l. No TMJ tenderness.  Mild mucoid nasal congestion, no maxillary sinus tenderness.  Positive frontal tenderness.  No temporal artery tenderness.  Neck: no cervical LN.  No trapezial muscle tenderness.  Tenderness at the occiput bilaterally.  No meningismus Respiratory: normal inspiratory effort Cardiovascular: Normal rate, regular rhythm GI:  nondistended skin: No rash, skin intact Musculoskeletal: no deformities Neurologic: Alert & oriented x 3, CN III-XII intact, romberg neg, finger-> nose, heel-> shin equal b/l, Romberg neg, tandem gait steady Psychiatric: Speech and behavior appropriate   ED Course   Medications  ketorolac (TORADOL) 30 MG/ML injection 30 mg (30 mg Intramuscular Given 07/13/22 0926)  ondansetron (ZOFRAN-ODT) disintegrating tablet 8 mg (8 mg Oral Given 07/13/22 0926)  dexamethasone (DECADRON) injection 10 mg (10 mg Intramuscular Given 07/13/22 4540)    Orders Placed This Encounter  Procedures   SARS Coronavirus 2 by RT PCR (hospital order, performed in [facility] hospital lab) *cepheid single result test* Anterior Nasal Swab    Standing Status:   Standing    Number of Occurrences:   1   Results for  orders placed or performed during the hospital encounter of 07/13/22 (from the past 24 hour(s))  SARS Coronavirus 2 by RT PCR (hospital order, performed in [facility] hospital lab) *cepheid single result test* Anterior Nasal Swab     Status: None   Collection Time: 07/13/22  9:28 AM   Specimen: Anterior Nasal Swab  Result Value Ref Range   SARS Coronavirus 2 by RT PCR NEGATIVE NEGATIVE   No results  found.   ED Clinical Impression  1. Other migraine without status migrainosus, not intractable     ED Assessment/Plan       Presentation consistent with a migraine.  However, given history of feeling feverish, will check a COVID as well.  Pt describing typical pain, no sudden onset. Doubt SAH, ICH or space occupying lesion. Pt without fevers/chills, Pt has no meningeal sx, no nuchal rigidity. Doubt meningitis. Pt with normal neuro exam, no evidence of CVA/TIA.  Pt BP not elevated significantly, doubt hypertensive emergency. No evidence of temporal artery tenderness, no evidence of glaucoma or other ocular pathology. Will give headache cocktail (dexamethasone 10 IM, zofran 8 po, toradol 30 IM) and reassess.  COVID-negative.  Reevaluation, patient states that she feels significantly better.  She would like to go home.  She is neurologically intact.  Will send home with Naprosyn/Tylenol, Zofran.  Will contact patient if COVID is positive.  Follow-up with PCP as needed.  ER return precautions given.  Discussed medical decision making, treatment plan and plan for follow-up with patient.  She agrees with plan.  Meds ordered this encounter  Medications   ketorolac (TORADOL) 30 MG/ML injection 30 mg   ondansetron (ZOFRAN-ODT) disintegrating tablet 8 mg   dexamethasone (DECADRON) injection 10 mg   naproxen (NAPROSYN) 500 MG tablet    Sig: Take 1 tablet (500 mg total) by mouth 2 (two) times daily.    Dispense:  20 tablet    Refill:  0   ondansetron (ZOFRAN-ODT) 8 MG disintegrating tablet    Sig: 1/2- 1 tablet q 8 hr prn nausea, vomiting    Dispense:  20 tablet    Refill:  0    *This clinic note was created using Scientist, clinical (histocompatibility and immunogenetics). Therefore, there may be occasional mistakes despite careful proofreading.  ?    Domenick Gong, MD 07/13/22 1028

## 2022-07-13 NOTE — ED Triage Notes (Signed)
Pt presents to UC c/o frontal headache onset x5 days, pt states it has been a while since she has had a headache this bad, light sensitivity.

## 2022-07-13 NOTE — Discharge Instructions (Signed)
Take the Naprosyn with 1000 mg twice a day.  You can also take the Zofran.  This helps with migraines even if you do not have nausea.  Push electrolyte containing fluids and rest.
